# Patient Record
Sex: Female | Born: 1940
Health system: Southern US, Community
[De-identification: ages and names within clinical notes are randomized; demographics above are authoritative.]

## PROBLEM LIST (undated history)

## (undated) DIAGNOSIS — K219 Gastro-esophageal reflux disease without esophagitis: Secondary | ICD-10-CM

## (undated) DIAGNOSIS — I10 Essential (primary) hypertension: Secondary | ICD-10-CM

## (undated) HISTORY — DX: Gastro-esophageal reflux disease without esophagitis: K21.9

## (undated) HISTORY — DX: Essential (primary) hypertension: I10

---

## 2014-10-29 ENCOUNTER — Ambulatory Visit: Payer: Medicare HMO | Attending: Orthopaedic Surgery | Admitting: Physical Therapy

## 2014-10-29 DIAGNOSIS — M546 Pain in thoracic spine: Secondary | ICD-10-CM | POA: Insufficient documentation

## 2014-10-29 DIAGNOSIS — M419 Scoliosis, unspecified: Secondary | ICD-10-CM | POA: Insufficient documentation

## 2014-11-03 ENCOUNTER — Ambulatory Visit: Payer: Medicare HMO | Attending: Orthopaedic Surgery | Admitting: Physical Therapy

## 2014-11-03 ENCOUNTER — Ambulatory Visit: Payer: Medicare HMO | Admitting: Physical Therapy

## 2014-11-03 DIAGNOSIS — M419 Scoliosis, unspecified: Secondary | ICD-10-CM | POA: Diagnosis not present

## 2014-11-03 DIAGNOSIS — M546 Pain in thoracic spine: Secondary | ICD-10-CM | POA: Insufficient documentation

## 2014-11-06 ENCOUNTER — Ambulatory Visit: Payer: Medicare HMO | Admitting: Physical Therapy

## 2014-11-06 DIAGNOSIS — M546 Pain in thoracic spine: Secondary | ICD-10-CM | POA: Diagnosis not present

## 2014-11-13 ENCOUNTER — Ambulatory Visit: Payer: Medicare HMO | Admitting: Physical Therapy

## 2014-11-13 DIAGNOSIS — M546 Pain in thoracic spine: Secondary | ICD-10-CM | POA: Diagnosis not present

## 2014-11-24 ENCOUNTER — Ambulatory Visit: Payer: Medicare HMO | Admitting: Physical Therapy

## 2014-11-24 ENCOUNTER — Encounter: Payer: Self-pay | Admitting: Physical Therapy

## 2014-11-24 DIAGNOSIS — M546 Pain in thoracic spine: Secondary | ICD-10-CM

## 2014-11-24 NOTE — Patient Instructions (Signed)
Went over HEP for T-band scapular stability, LE flexibility and lumbar stability.

## 2014-11-24 NOTE — Therapy (Signed)
[facility] [address] [place], [place], [address] Phone: [phone]   Fax:  [phone]  November 24, 2014   @[email or web address] @  Physical Therapy Discharge Summary  Patient: Aimee Hutchinson  MRN: [id number]  Date of Birth: Mar 01, 1941   Diagnosis: Midline thoracic back pain Referring Provider:  Kathryne HitchBlackman, Christopher Y*  The above patient had been seen in Physical Therapy 5 times of 5 treatments scheduled The treatment consisted of exercises, posture and body mechanics instruction and modalities The patient is: much improved and now pain free  Subjective: No pain  Discharge Findings: independent with HEP  Functional Status at Discharge: no pain       Plan - 11/24/14 1126    Clinical Impression Statement Patient with history of back pain and scoliosis, she has been doing her exercises and is doing very well, even after vacation   Rehab Potential Excellent   PT Frequency 1x / week   PT Duration 2 weeks   PT Treatment/Interventions ADLs/Self Care Home Management;Therapeutic exercise;Patient/family education   PT Next Visit Plan Will D/C      Sincerely,   Jearld LeschALBRIGHT,Helix Lafontaine W, PT   CC @CCLISTRESTNAME @  [facility] [address] [place], [place], [address] Phone: [phone]   Fax:  [phone]

## 2018-07-09 ENCOUNTER — Other Ambulatory Visit: Payer: Self-pay

## 2018-07-09 ENCOUNTER — Emergency Department (HOSPITAL_BASED_OUTPATIENT_CLINIC_OR_DEPARTMENT_OTHER): Payer: Medicare HMO

## 2018-07-09 ENCOUNTER — Emergency Department (HOSPITAL_BASED_OUTPATIENT_CLINIC_OR_DEPARTMENT_OTHER)
Admission: EM | Admit: 2018-07-09 | Discharge: 2018-07-09 | Disposition: A | Discharge status: Home / Self Care | Payer: Medicare HMO | Attending: Emergency Medicine | Admitting: Emergency Medicine

## 2018-07-09 ENCOUNTER — Encounter (HOSPITAL_BASED_OUTPATIENT_CLINIC_OR_DEPARTMENT_OTHER): Payer: Self-pay | Admitting: Emergency Medicine

## 2018-07-09 DIAGNOSIS — K5641 Fecal impaction: Secondary | ICD-10-CM | POA: Diagnosis not present

## 2018-07-09 DIAGNOSIS — Z91040 Latex allergy status: Secondary | ICD-10-CM | POA: Diagnosis not present

## 2018-07-09 DIAGNOSIS — K5900 Constipation, unspecified: Secondary | ICD-10-CM | POA: Diagnosis present

## 2018-07-09 DIAGNOSIS — I10 Essential (primary) hypertension: Secondary | ICD-10-CM | POA: Diagnosis not present

## 2018-07-09 MED ORDER — FLEET ENEMA 7-19 GM/118ML RE ENEM
1.0000 | ENEMA | Freq: Once | RECTAL | Status: AC
  Administered 2018-07-09: 1 via RECTAL
  Filled 2018-07-09: qty 1

## 2018-07-09 MED ORDER — POLYETHYLENE GLYCOL 3350 17 G PO PACK: 17.0000 g | PACK | Freq: Every day | ORAL | 0 refills | Status: AC

## 2018-07-09 MED FILL — SM CLEARLAX POWDER: 14 days supply | Qty: 238 | Fill #0

## 2018-07-09 NOTE — ED Notes (Signed)
Pt had excellent results from fleets enema.  Noted Large amount of formed brown stool and soft brown stool.

## 2018-07-09 NOTE — ED Provider Notes (Signed)
[facility] EMERGENCY DEPARTMENT Provider Note   CSN: [id number] Arrival date & time: 07/09/18  1002     History   Chief Complaint Chief Complaint  Patient presents with  . Constipation    HPI Aimee Hutchinson is a 77 y.o. female.  Pt presents to the ED today with constipation that has been ongoing for 1 week.  The pt has taken senekot and colace without relief.  She tried an enema this morning without success.  She denies f/v.  She feels a lot of rectal pressure.     Past Medical History:  Diagnosis Date  . GERD (gastroesophageal reflux disease)   . Hypertension     There are no active problems to display for this patient.   History reviewed. No pertinent surgical history.   OB History   None      Home Medications    Prior to Admission medications   Medication Sig Start Date End Date Taking? Authorizing Provider  polyethylene glycol (MIRALAX) packet Take 17 g by mouth daily. 07/09/18   Jacalyn Lefevre, MD    Family History No family history on file.  Social History Social History   Tobacco Use  . Smoking status: Not on file  Substance Use Topics  . Alcohol use: Not on file  . Drug use: Not on file     Allergies   Latex   Review of Systems Review of Systems  Gastrointestinal: Positive for constipation.  All other systems reviewed and are negative.    Physical Exam Updated Vital Signs BP (!) 145/75 (BP Location: Right Arm)   Pulse 92   Temp 97.6 F (36.4 C) (Oral)   Resp 16   Ht 5\' 6"  (1.676 m)   Wt 66.7 kg   SpO2 98%   BMI 23.73 kg/m   Physical Exam  Constitutional: She is oriented to person, place, and time. She appears well-developed and well-nourished.  HENT:  Head: Normocephalic and atraumatic.  Right Ear: External ear normal.  Left Ear: External ear normal.  Nose: Nose normal.  Mouth/Throat: Oropharynx is clear and moist.  Eyes: Pupils are equal, round, and reactive to light. Conjunctivae and EOM are normal.    Neck: Normal range of motion. Neck supple.  Cardiovascular: Normal rate, regular rhythm, normal heart sounds and intact distal pulses.  Pulmonary/Chest: Effort normal and breath sounds normal.  Abdominal: Soft. Bowel sounds are decreased.  Musculoskeletal: Normal range of motion.  Neurological: She is alert and oriented to person, place, and time.  Skin: Skin is warm. Capillary refill takes less than 2 seconds.  Psychiatric: She has a normal mood and affect. Her behavior is normal. Judgment and thought content normal.  Nursing note and vitals reviewed.    ED Treatments / Results  Labs (all labs ordered are listed, but only abnormal results are displayed) Labs Reviewed - No data to display  EKG None  Radiology Dg Abdomen Acute W/chest  Result Date: 07/09/2018 CLINICAL DATA:  Constipation for 2 weeks EXAM: DG ABDOMEN ACUTE W/ 1V CHEST COMPARISON:  None. FINDINGS: No active infiltrate or effusion is seen. Mediastinal and hilar contours are unremarkable. The heart is mildly enlarged. No acute bony abnormality is seen. Supine and erect views of the abdomen show a moderate amount of feces throughout the colon. No free air is seen on the erect view. No bowel obstruction is evident. There is a moderate amount of feces particularly within the rectosigmoid colon. No opaque calculi are seen. There are degenerative changes in  the lower lumbar spine. IMPRESSION: 1. Moderate amount of feces within the colon particularly in the rectosigmoid colon. No bowel obstruction or free air. 2. No active lung disease. Electronically Signed   By: Dwyane Dee M.D.   On: 07/09/2018 10:46    Procedures Fecal disimpaction Date/Time: 07/09/2018 11:09 AM Performed by: Jacalyn Lefevre, MD Authorized by: Jacalyn Lefevre, MD  Consent: Verbal consent obtained. Risks and benefits: risks, benefits and alternatives were discussed Consent given by: patient Patient understanding: patient states understanding of the  procedure being performed Patient identity confirmed: verbally with patient Time out: Immediately prior to procedure a "time out" was called to verify the correct patient, procedure, equipment, support staff and site/side marked as required. Comments: Large amount of stool manually removed.    (including critical care time)  Medications Ordered in ED Medications  sodium phosphate (FLEET) 7-19 GM/118ML enema 1 enema (1 enema Rectal Given 07/09/18 1129)     Initial Impression / Assessment and Plan / ED Course  I have reviewed the triage vital signs and the nursing notes.  Pertinent labs & imaging results that were available during my care of the patient were reviewed by me and considered in my medical decision making (see chart for details).     Disimpaction done which removed a large amount of stool from rectum.  After that, an enema was done which was very successful.  Pt is feeling much better.  She is stable for d/c.  Final Clinical Impressions(s) / ED Diagnoses   Final diagnoses:  Constipation, unspecified constipation type  Fecal impaction in rectum [facility])    ED Discharge Orders         Ordered    polyethylene glycol (MIRALAX) packet  Daily     07/09/18 1216           Jacalyn Lefevre, MD 07/09/18 1218

## 2018-07-09 NOTE — ED Triage Notes (Signed)
Pt is constipated.  Pt states she has not gone in over a week.  Has tried enema at home, got two pieces.  Has taken colace 2 days ago, and senekot yesterday with no results.

## 2019-02-21 IMAGING — DX DG ABDOMEN ACUTE W/ 1V CHEST
4 series · 4 of 4 positions shown · non-contrast
Comparison: None.

CLINICAL DATA: Constipation for 2 weeks

EXAM:
DG ABDOMEN ACUTE W/ 1V CHEST

[chest pa]
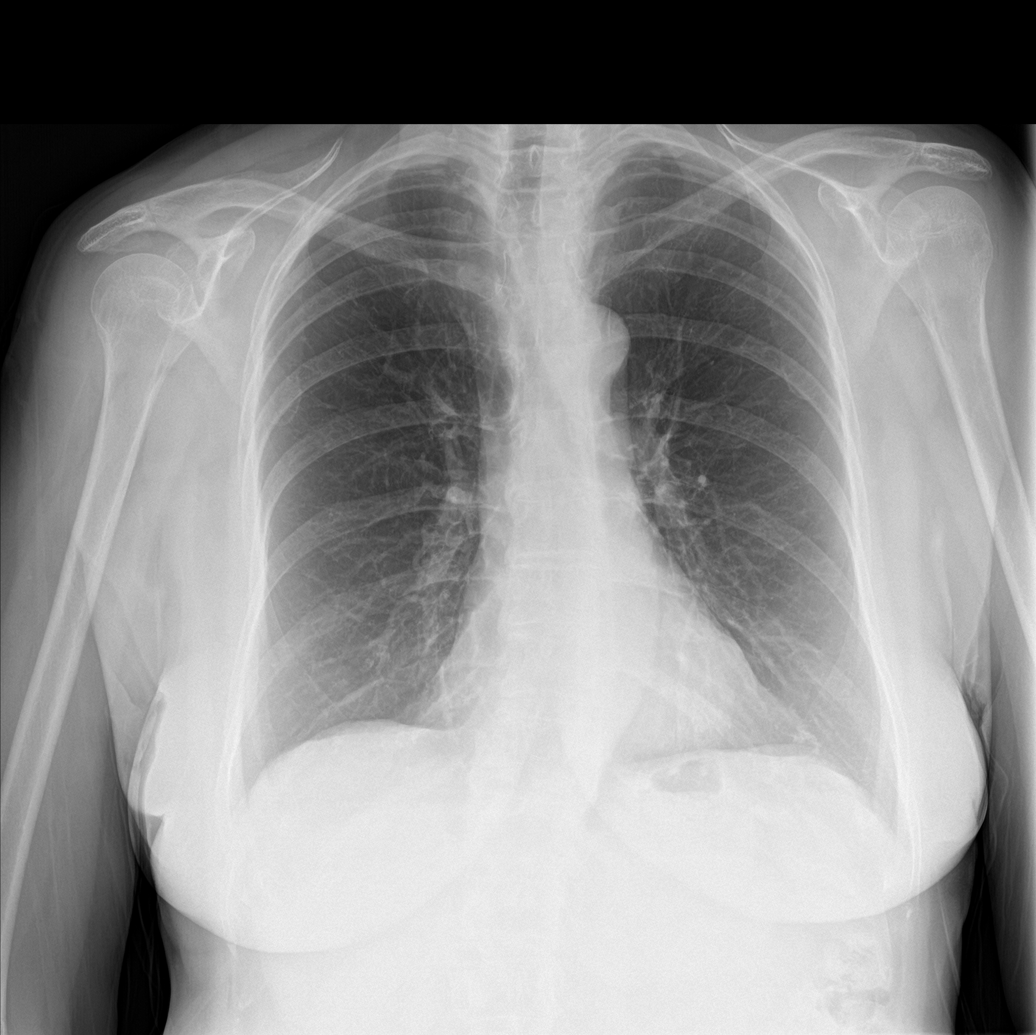

[abdomen erect]
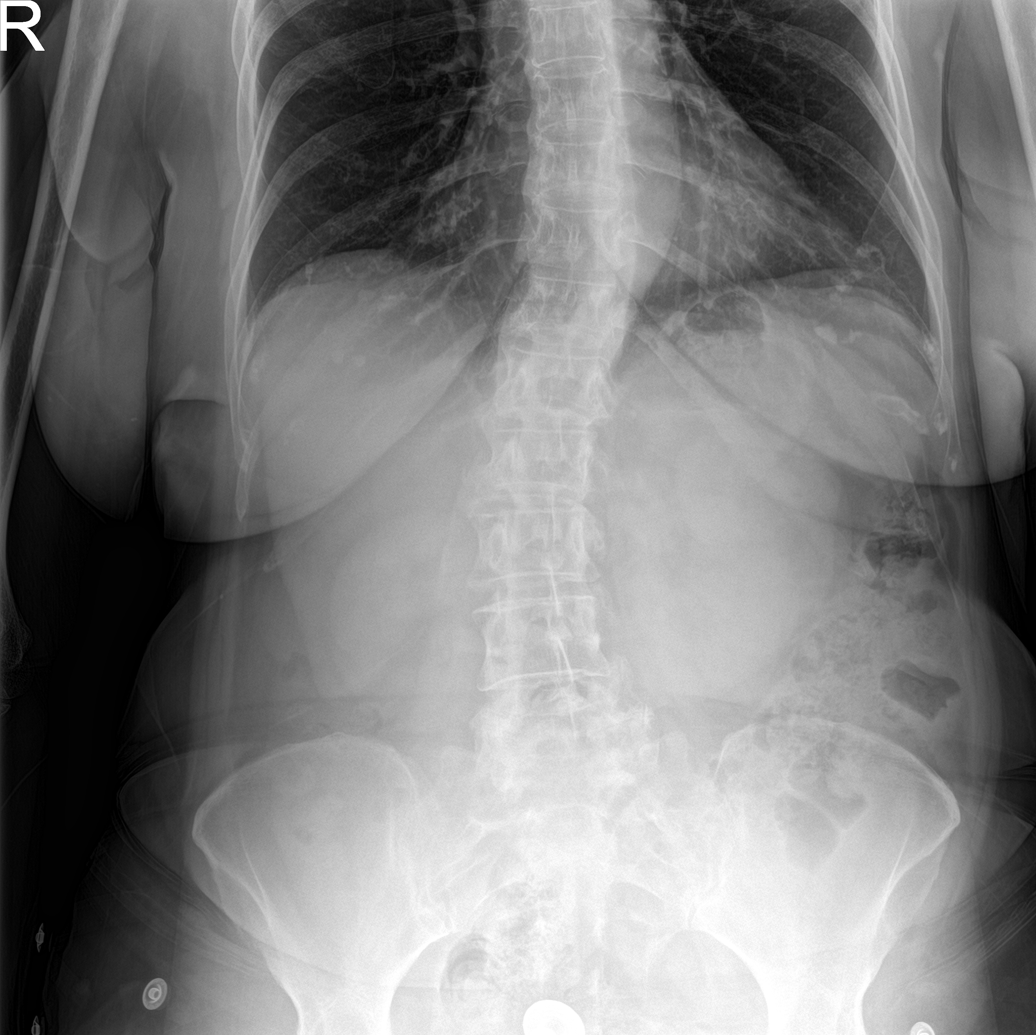

[abdomen supine (1 of 2)]
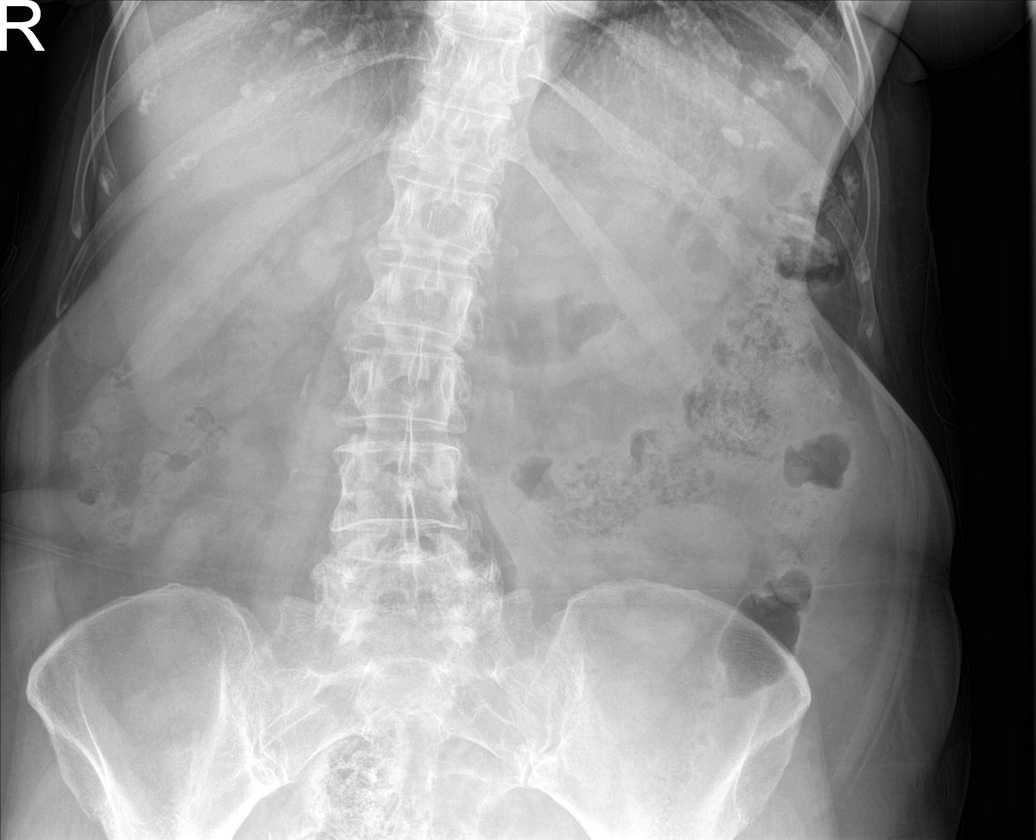

[abdomen supine (2 of 2)]
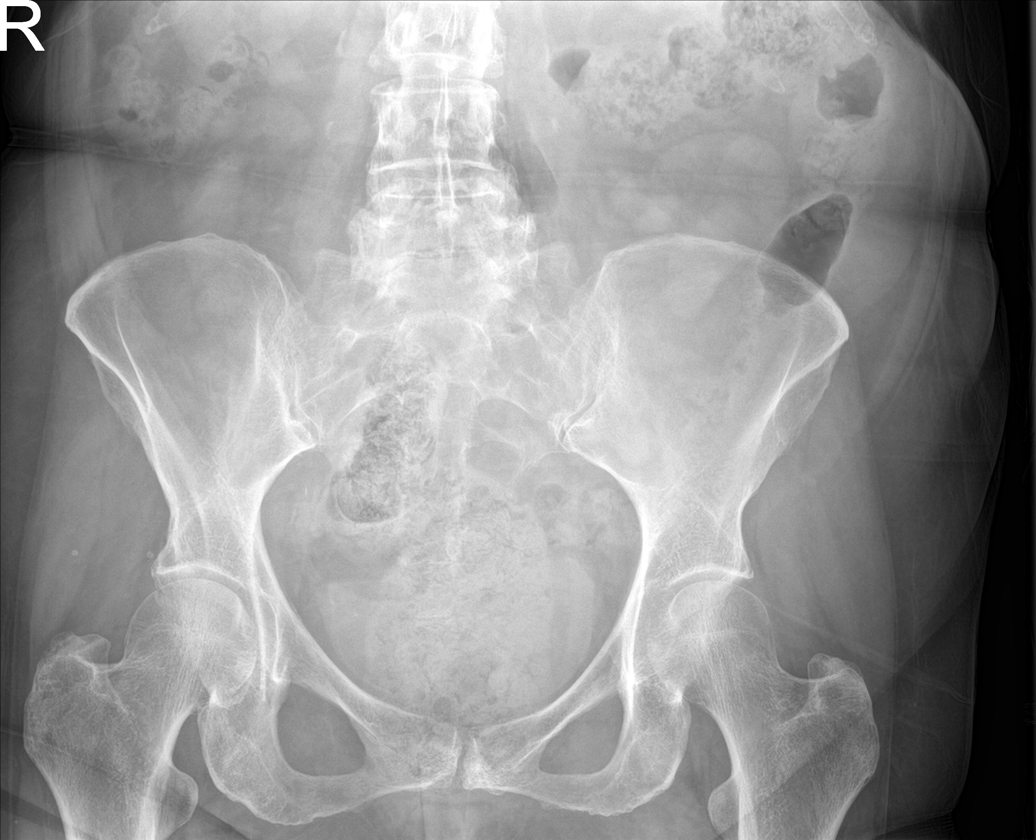

[4 of 4 positions shown; findings below may reference images not displayed]

FINDINGS: No active infiltrate or effusion is seen. Mediastinal and hilar
contours are unremarkable. The heart is mildly enlarged. No acute
bony abnormality is seen.

Supine and erect views of the abdomen show a moderate amount of
feces throughout the colon. No free air is seen on the erect view.
No bowel obstruction is evident. There is a moderate amount of feces
particularly within the rectosigmoid colon. No opaque calculi are
seen. There are degenerative changes in the lower lumbar spine.
IMPRESSION: 1. Moderate amount of feces within the colon particularly in the
rectosigmoid colon. No bowel obstruction or free air.
2. No active lung disease.
